# Patient Record
Sex: Female | Born: 1965 | Race: White | Hispanic: No | Smoking: Current every day smoker
Health system: Southern US, Community
[De-identification: ages and names within clinical notes are randomized; demographics above are authoritative.]

## PROBLEM LIST (undated history)

## (undated) DIAGNOSIS — D649 Anemia, unspecified: Secondary | ICD-10-CM

## (undated) HISTORY — PX: OTHER SURGICAL HISTORY: SHX169

## (undated) HISTORY — PX: ABDOMINAL HYSTERECTOMY: SHX81

## (undated) HISTORY — DX: Anemia, unspecified: D64.9

---

## 2008-01-09 ENCOUNTER — Emergency Department (HOSPITAL_COMMUNITY): Admission: EM | Admit: 2008-01-09 | Discharge: 2008-01-09 | Payer: Self-pay | Admitting: Emergency Medicine

## 2014-10-18 ENCOUNTER — Emergency Department (HOSPITAL_COMMUNITY): Payer: Self-pay

## 2014-10-18 ENCOUNTER — Emergency Department (HOSPITAL_COMMUNITY)
Admission: EM | Admit: 2014-10-18 | Discharge: 2014-10-18 | Disposition: A | Discharge status: Home / Self Care | Payer: Self-pay | Attending: Emergency Medicine | Admitting: Emergency Medicine

## 2014-10-18 ENCOUNTER — Encounter (HOSPITAL_COMMUNITY): Payer: Self-pay | Admitting: Emergency Medicine

## 2014-10-18 DIAGNOSIS — J018 Other acute sinusitis: Secondary | ICD-10-CM | POA: Insufficient documentation

## 2014-10-18 DIAGNOSIS — R059 Cough, unspecified: Secondary | ICD-10-CM

## 2014-10-18 DIAGNOSIS — Z720 Tobacco use: Secondary | ICD-10-CM | POA: Insufficient documentation

## 2014-10-18 DIAGNOSIS — J209 Acute bronchitis, unspecified: Secondary | ICD-10-CM | POA: Insufficient documentation

## 2014-10-18 DIAGNOSIS — R05 Cough: Secondary | ICD-10-CM

## 2014-10-18 DIAGNOSIS — J40 Bronchitis, not specified as acute or chronic: Secondary | ICD-10-CM

## 2014-10-18 MED ORDER — AMOXICILLIN 250 MG PO CAPS
ORAL_CAPSULE | ORAL | Status: AC
  Filled 2014-10-18: qty 1

## 2014-10-18 MED ORDER — PREDNISONE 10 MG PO TABS: ORAL_TABLET | ORAL | Status: DC

## 2014-10-18 MED ORDER — LORATADINE-PSEUDOEPHEDRINE ER 5-120 MG PO TB12: 1.0000 | ORAL_TABLET | Freq: Two times a day (BID) | ORAL | Status: DC

## 2014-10-18 MED ORDER — AMOXICILLIN 250 MG PO CAPS
500.0000 mg | ORAL_CAPSULE | Freq: Once | ORAL | Status: AC
  Administered 2014-10-18: 500 mg via ORAL
  Filled 2014-10-18: qty 2

## 2014-10-18 MED ORDER — ALBUTEROL SULFATE HFA 108 (90 BASE) MCG/ACT IN AERS
2.0000 | INHALATION_SPRAY | Freq: Once | RESPIRATORY_TRACT | Status: AC
Start: 2014-10-18 — End: 2014-10-18
  Administered 2014-10-18: 2 via RESPIRATORY_TRACT
  Filled 2014-10-18: qty 6.7

## 2014-10-18 MED ORDER — AMOXICILLIN 500 MG PO CAPS: 500.0000 mg | ORAL_CAPSULE | Freq: Three times a day (TID) | ORAL | Status: DC

## 2014-10-18 MED ORDER — PREDNISONE 50 MG PO TABS
60.0000 mg | ORAL_TABLET | Freq: Once | ORAL | Status: AC
  Administered 2014-10-18: 60 mg via ORAL
  Filled 2014-10-18 (×2): qty 1

## 2014-10-18 NOTE — Discharge Instructions (Signed)
Your x-ray suggests bronchitis and emphysema, please stop smoking.  Please use Amoxil 3 times daily, prednisone daily with food, Claritin-D 2 times daily for congestion. Please use albuterol 2 puffs every 4 hours. Please [facility], or the [facility] clinic to establish a primary care physician. Sinusitis Sinusitis is redness, soreness, and inflammation of the paranasal sinuses. Paranasal sinuses are air pockets within the bones of your face (beneath the eyes, the middle of the forehead, or above the eyes). In healthy paranasal sinuses, mucus is able to drain out, and air is able to circulate through them by way of your nose. However, when your paranasal sinuses are inflamed, mucus and air can become trapped. This can allow bacteria and other germs to grow and cause infection. Sinusitis can develop quickly and last only a short time (acute) or continue over a long period (chronic). Sinusitis that lasts for more than 12 weeks is considered chronic.  CAUSES  Causes of sinusitis include:  Allergies.  Structural abnormalities, such as displacement of the cartilage that separates your nostrils (deviated septum), which can decrease the air flow through your nose and sinuses and affect sinus drainage.  Functional abnormalities, such as when the small hairs (cilia) that line your sinuses and help remove mucus do not work properly or are not present. SIGNS AND SYMPTOMS  Symptoms of acute and chronic sinusitis are the same. The primary symptoms are pain and pressure around the affected sinuses. Other symptoms include:  Upper toothache.  Earache.  Headache.  Bad breath.  Decreased sense of smell and taste.  A cough, which worsens when you are lying flat.  Fatigue.  Fever.  Thick drainage from your nose, which often is green and may contain pus (purulent).  Swelling and warmth over the affected sinuses. DIAGNOSIS  Your health care provider will perform a  physical exam. During the exam, your health care provider may:  Look in your nose for signs of abnormal growths in your nostrils (nasal polyps).  Tap over the affected sinus to check for signs of infection.  View the inside of your sinuses (endoscopy) using an imaging device that has a light attached (endoscope). If your health care provider suspects that you have chronic sinusitis, one or more of the following tests may be recommended:  Allergy tests.  Nasal culture. A sample of mucus is taken from your nose, sent to a lab, and screened for bacteria.  Nasal cytology. A sample of mucus is taken from your nose and examined by your health care provider to determine if your sinusitis is related to an allergy. TREATMENT  Most cases of acute sinusitis are related to a viral infection and will resolve on their own within 10 days. Sometimes medicines are prescribed to help relieve symptoms (pain medicine, decongestants, nasal steroid sprays, or saline sprays).  However, for sinusitis related to a bacterial infection, your health care provider will prescribe antibiotic medicines. These are medicines that will help kill the bacteria causing the infection.  Rarely, sinusitis is caused by a fungal infection. In theses cases, your health care provider will prescribe antifungal medicine. For some cases of chronic sinusitis, surgery is needed. Generally, these are cases in which sinusitis recurs more than 3 times per year, despite other treatments. HOME CARE INSTRUCTIONS   Drink plenty of water. Water helps thin the mucus so your sinuses can drain more easily.  Use a humidifier.  Inhale steam 3 to 4 times a day (for example, sit in the  bathroom with the shower running).  Apply a warm, moist washcloth to your face 3 to 4 times a day, or as directed by your health care provider.  Use saline nasal sprays to help moisten and clean your sinuses.  Take medicines only as directed by your health care  provider.  If you were prescribed either an antibiotic or antifungal medicine, finish it all even if you start to feel better. SEEK IMMEDIATE MEDICAL CARE IF:  You have increasing pain or severe headaches.  You have nausea, vomiting, or drowsiness.  You have swelling around your face.  You have vision problems.  You have a stiff neck.  You have difficulty breathing. MAKE SURE YOU:   Understand these instructions.  Will watch your condition.  Will get help right away if you are not doing well or get worse. Document Released: 10/01/2005 Document Revised: 02/15/2014 Document Reviewed: 10/16/2011 [facility] Patient Information 2015 [place], [place]. This information is not intended to replace advice given to you by your health care provider. Make sure you discuss any questions you have with your health care provider.

## 2014-10-18 NOTE — ED Provider Notes (Signed)
CSN: [id number]     Arrival date & time 10/18/14  1315 History   First MD Initiated Contact with Patient 10/18/14 1549     Chief Complaint  Patient presents with  . Sinusitis     (Consider location/radiation/quality/duration/timing/severity/associated sxs/prior Treatment) HPI Comments: Patient is a 49 year old female who presents to the emergency department with complaint of cough and pain behind the ears.  The patient states that she has had problems with sinus congestion for 2-3 weeks. She has been trying over-the-counter medications and home remedies. In the last 2-3 days she's been having increasing pain behind both ears, and pressure about the sinus areas on her face. She denies any high fever, she has not been noticing blood in the mucus from her nasal passages. He has not been coughing up any blood. The patient denies any injury to her sinus areas or to her lungs. It is of note that she is a smoker, and has been smoking since age 58. The patient states that changes in environment, and particularly going from hot to cold or from cold to hot seems to aggravate her cough and her congestion even more.  The history is provided by the patient.    History reviewed. No pertinent past medical history. Past Surgical History  Procedure Laterality Date  . Abdominal hysterectomy    . Cervical cancer     No family history on file. History  Substance Use Topics  . Smoking status: Current Every Day Smoker -- 0.50 packs/day    Types: Cigarettes  . Smokeless tobacco: Not on file  . Alcohol Use: No   OB History    No data available     Review of Systems  Constitutional: Negative for activity change.       All ROS Neg except as noted in HPI  HENT: Positive for congestion, ear pain, postnasal drip and sinus pressure. Negative for nosebleeds.   Eyes: Negative for photophobia and discharge.  Respiratory: Positive for cough and wheezing. Negative for shortness of breath.   Cardiovascular:  Negative for chest pain and palpitations.  Gastrointestinal: Negative for abdominal pain and blood in stool.  Genitourinary: Negative for dysuria, frequency and hematuria.  Musculoskeletal: Negative for back pain, arthralgias and neck pain.  Skin: Negative.   Neurological: Negative for dizziness, seizures and speech difficulty.  Psychiatric/Behavioral: Negative for hallucinations and confusion.      Allergies  Review of patient's allergies indicates no known allergies.  Home Medications   Prior to Admission medications   Medication Sig Start Date End Date Taking? Authorizing Provider  ibuprofen (ADVIL,MOTRIN) 200 MG tablet Take 200 mg by mouth every 6 (six) hours as needed for mild pain or moderate pain.   Yes Historical Provider, MD   BP 114/86 mmHg  Pulse 75  Temp(Src) 98.1 F (36.7 C) (Oral)  Resp 18  Ht 5' 4.5" (1.638 m)  Wt 130 lb (58.968 kg)  BMI 21.98 kg/m2  SpO2 100% Physical Exam  Constitutional: She is oriented to person, place, and time. She appears well-developed and well-nourished.  Non-toxic appearance.  HENT:  Head: Normocephalic.  Right Ear: Tympanic membrane and external ear normal.  Left Ear: Tympanic membrane and external ear normal.  Nasal congestion present. Pain to percussion over the maxillary sinuses.  Nasal turbinates are swollen.  No mastoid area redness, swelling, or tenderness.  Eyes: EOM and lids are normal. Pupils are equal, round, and reactive to light.  Neck: Normal range of motion. Neck supple. Carotid bruit is not present.  Cardiovascular: Normal rate, regular rhythm, normal heart sounds, intact distal pulses and normal pulses.   Pulmonary/Chest: No respiratory distress. She has rhonchi.  Abdominal: Soft. Bowel sounds are normal. There is no tenderness. There is no guarding.  Musculoskeletal: Normal range of motion.  Lymphadenopathy:       Head (right side): No submandibular adenopathy present.       Head (left side): No submandibular  adenopathy present.    She has no cervical adenopathy.  Neurological: She is alert and oriented to person, place, and time. She has normal strength. No cranial nerve deficit or sensory deficit.  Skin: Skin is warm and dry.  Psychiatric: She has a normal mood and affect. Her speech is normal.  Nursing note and vitals reviewed.   ED Course  Procedures (including critical care time) Labs Review Labs Reviewed - No data to display  Imaging Review Dg Chest 2 View  10/18/2014   CLINICAL DATA:  Sinus congestion and bilateral ear pressure for 2 weeks. Nonproductive cough.  EXAM: CHEST  2 VIEW  COMPARISON:  PA and lateral chest 01/09/2008.  FINDINGS: The chest appears hyperexpanded with attenuation of the pulmonary vasculature. The lungs are clear. Heart size is normal. No pneumothorax or pleural effusion.  IMPRESSION: No acute disease.  Appearance of the chest suggestive of emphysema.   Electronically Signed   By: Drusilla Kanner M.D.   On: 10/18/2014 13:51     EKG Interpretation None      MDM  Vital signs within normal limits. Pulse oximetry is 100% on room air. Within normal limits by my interpretation. Chest x-ray shows no acute disease, but is suggestive of emphysema.  Findings on the examination and findings on the x-ray discussed with the patient in terms which he understands. Prescription for Amoxil, Claritin D, and prednisone given to the patient. An albuterol inhaler was also given to the patient. The patient is given resources to establish primary care physician with the [facility], or the community wellness [facility] in [place].    Final diagnoses:  Other acute sinusitis  Bronchitis    *I have reviewed nursing notes, vital signs, and all appropriate lab and imaging results for this patient.[address] PA-C 10/18/14 1725  Vida Roller, MD 10/18/14 [phone]

## 2014-10-18 NOTE — ED Notes (Signed)
PT c/o sinus congestion with pressure behind both ears x2 weeks. PT also reports nonproductive cough.

## 2015-07-18 IMAGING — CR DG CHEST 2V
2 series · 2 of 2 positions shown · non-contrast
Comparison: PA and lateral chest 01/09/2008.

CLINICAL DATA: Sinus congestion and bilateral ear pressure for 2
weeks. Nonproductive cough.

EXAM:
CHEST  2 VIEW

[view not recorded (1 of 2)]
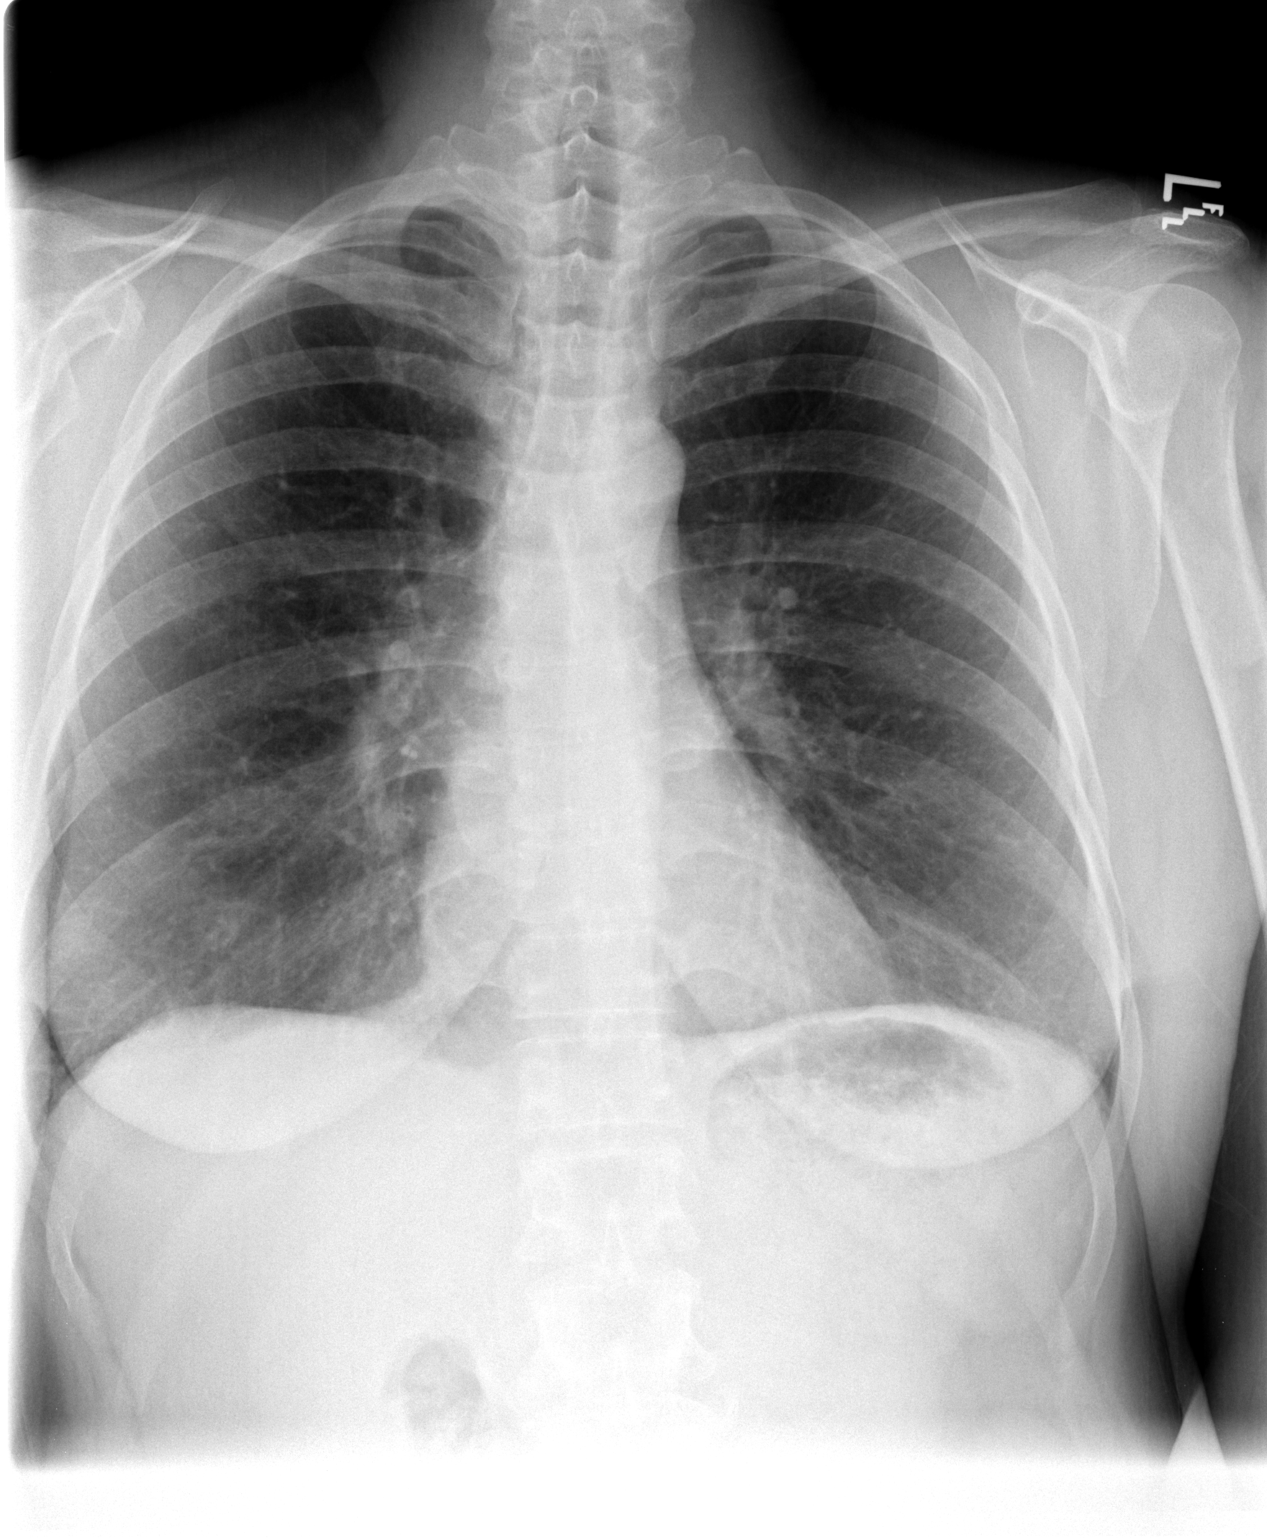

[view not recorded (2 of 2)]
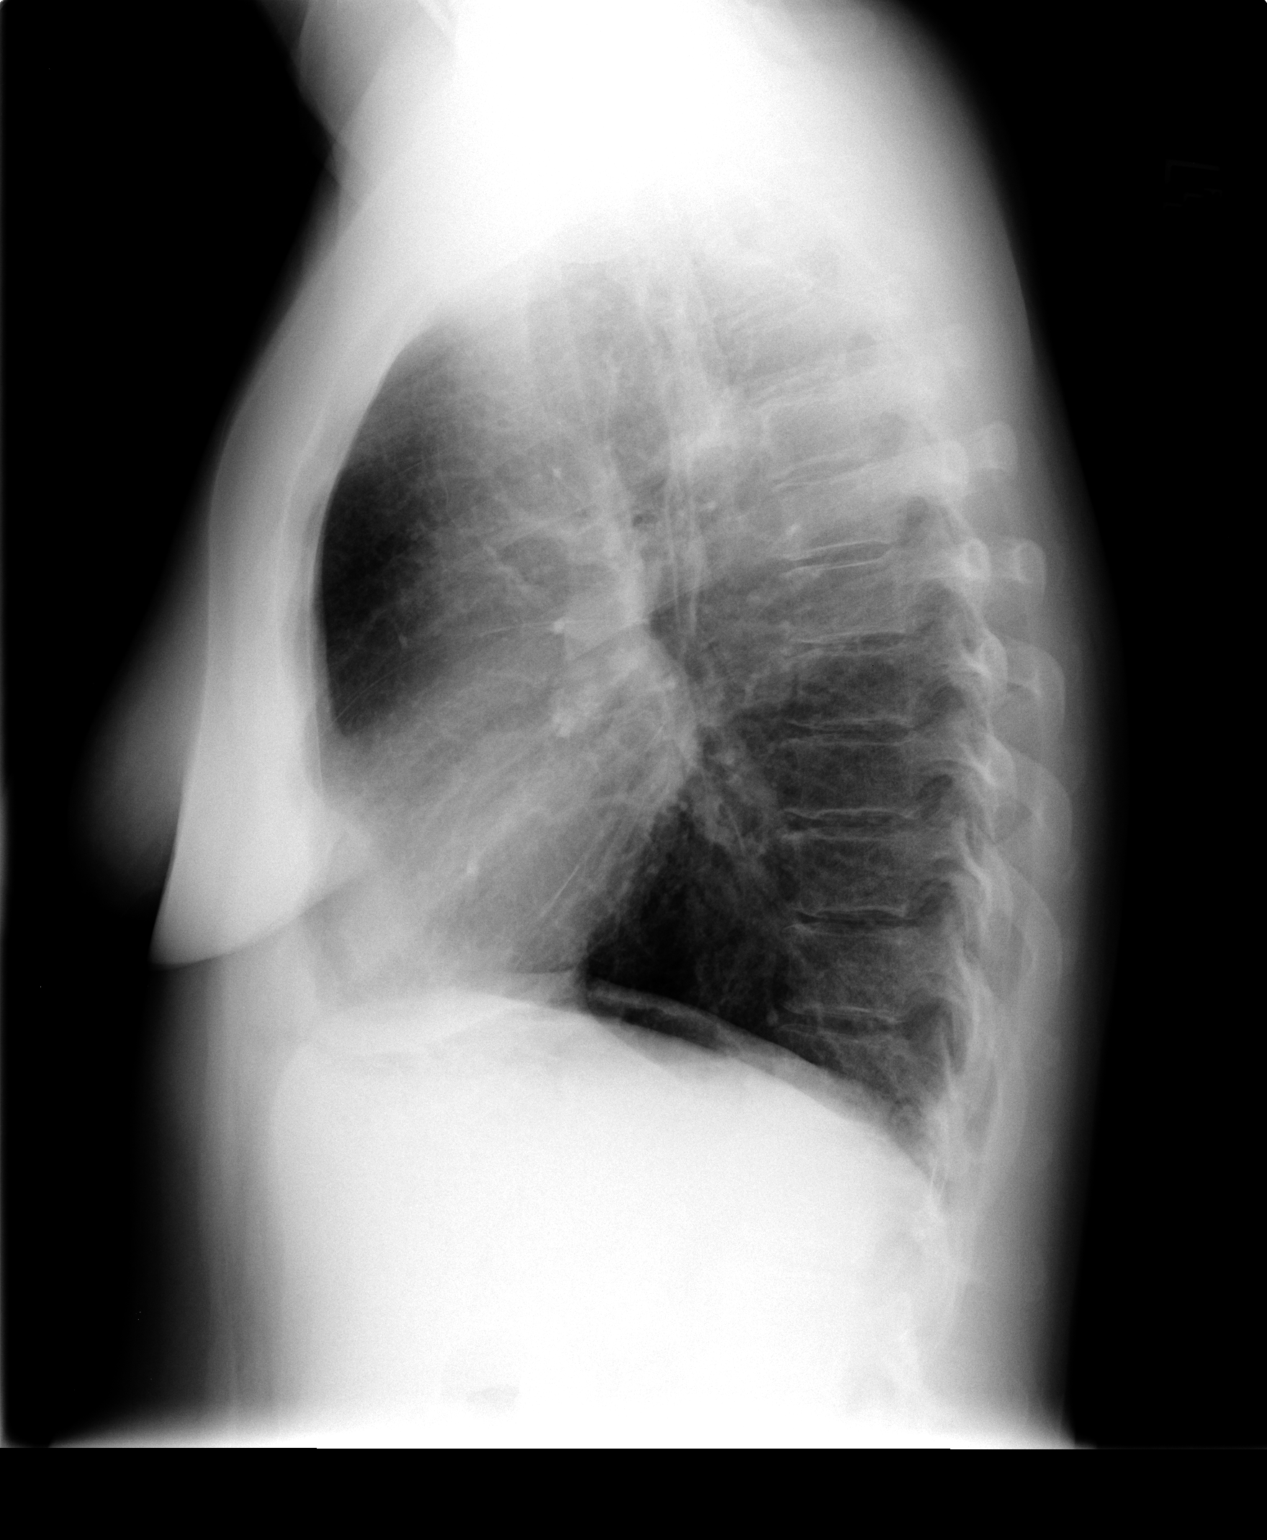

[2 of 2 positions shown; findings below may reference images not displayed]

FINDINGS: The chest appears hyperexpanded with attenuation of the pulmonary
vasculature. The lungs are clear. Heart size is normal. No
pneumothorax or pleural effusion.
IMPRESSION: No acute disease.  Appearance of the chest suggestive of emphysema.

## 2016-10-25 ENCOUNTER — Ambulatory Visit (INDEPENDENT_AMBULATORY_CARE_PROVIDER_SITE_OTHER): Payer: Self-pay | Admitting: Family Medicine

## 2016-10-25 ENCOUNTER — Encounter: Payer: Self-pay | Admitting: Family Medicine

## 2016-10-25 VITALS — BP 97/68 | HR 94 | Temp 98.5°F | Ht 65.0 in | Wt 125.8 lb

## 2016-10-25 DIAGNOSIS — Z720 Tobacco use: Secondary | ICD-10-CM

## 2016-10-25 DIAGNOSIS — R059 Cough, unspecified: Secondary | ICD-10-CM

## 2016-10-25 DIAGNOSIS — R05 Cough: Secondary | ICD-10-CM

## 2016-10-25 MED ORDER — PROMETHAZINE HCL 25 MG PO TABS: 25.0000 mg | ORAL_TABLET | Freq: Three times a day (TID) | ORAL | 0 refills | Status: AC | PRN

## 2016-10-25 MED ORDER — LEVOFLOXACIN 500 MG PO TABS: 500.0000 mg | ORAL_TABLET | Freq: Every day | ORAL | 0 refills | Status: AC

## 2016-10-25 MED ORDER — PREDNISONE 20 MG PO TABS: 40.0000 mg | ORAL_TABLET | Freq: Every day | ORAL | 0 refills | Status: AC

## 2016-10-25 NOTE — Progress Notes (Signed)
   HPI  Patient presents today here to establish care with acute illness.  Patient explains over the last 3 days she's had body aches, cough, nasal congestion, chills. She is measured fever as high as 102. She also has mild shortness of breath. She is a smoker but has been abstaining this week.  She has a history of pneumonia once previously and seemed to be more severe illness.  Patient has missed work 2 days and is still in her trial phase at work so she needs a note for work.  She does not have insurance yet.  She states that she's had several episodes of posttussive emesis and is having difficulty tolerating foods and fluids. She is able to tolerate small frequent amounts of fluid.  PMH: Smoker, cervical cancer Past surgical history- hysterectomy Social history- denies alcohol and drug use. Family history positive for diabetes and hypertension in father, heart disease in mother ROS: Per HPI  Objective: Ht 5\' 5"  (1.651 m)  Gen: NAD, alert, cooperative with exam HEENT: NCAT, no facial tenderness to palpation, oropharynx moist and clear, nares clear, TMs normal bilaterally CV: RRR, good S1/S2, no murmur Resp: Nonlabored, some scattered coarse breath sounds, some expiratory wheezes that are very faint, good air movement Ext: No edema, warm Neuro: Alert and oriented, No gross deficits  Assessment and plan:  # Cough Cough with fever, likely influenza. However patient has severe bronchitic changes as well with a history of smoking I have decided to treat her more aggressively with prednisone, also considering her limited access to medical care I have given her a course of Levaquin. Phenergan with posttussive emesis and difficulty tolerating foods and fluids currently. One week out of work from the onset of symptoms, return next Tuesday  Tobacco abuse Patient has been abstaining this week, she is considering quitting  Committed return when patient has insurance coverage for full  physical exam. No Pap smear necessary with history of hysterectomy. Mammogram and colonoscopy are both new. Screening labs would be helpful.   Meds ordered this encounter  Medications  . promethazine (PHENERGAN) 25 MG tablet    Sig: Take 1 tablet (25 mg total) by mouth every 8 (eight) hours as needed for nausea or vomiting.    Dispense:  20 tablet    Refill:  0  . predniSONE (DELTASONE) 20 MG tablet    Sig: Take 2 tablets (40 mg total) by mouth daily with breakfast.    Dispense:  10 tablet    Refill:  0  . levofloxacin (LEVAQUIN) 500 MG tablet    Sig: Take 1 tablet (500 mg total) by mouth daily.    Dispense:  7 tablet    Refill:  0    Murtis SinkSam Sheralee Qazi, MD [facility] 10/25/2016, 11:45 AM

## 2016-10-25 NOTE — Patient Instructions (Signed)
Great to see you!  Start prednisone today as well as levaquin today.   Use phenergan for nausea, only as needed. It will likely make you sleepy.

## 2022-07-25 ENCOUNTER — Emergency Department (HOSPITAL_COMMUNITY): Payer: 59

## 2022-07-25 ENCOUNTER — Emergency Department (HOSPITAL_COMMUNITY)
Admission: EM | Admit: 2022-07-25 | Discharge: 2022-07-25 | Disposition: A | Discharge status: Home / Self Care | Payer: 59 | Attending: Student | Admitting: Student

## 2022-07-25 ENCOUNTER — Other Ambulatory Visit: Payer: Self-pay

## 2022-07-25 ENCOUNTER — Encounter (HOSPITAL_COMMUNITY): Payer: Self-pay | Admitting: Emergency Medicine

## 2022-07-25 DIAGNOSIS — J019 Acute sinusitis, unspecified: Secondary | ICD-10-CM | POA: Insufficient documentation

## 2022-07-25 DIAGNOSIS — R0981 Nasal congestion: Secondary | ICD-10-CM | POA: Diagnosis not present

## 2022-07-25 LAB — GROUP A STREP BY PCR: Group A Strep by PCR: NOT DETECTED

## 2022-07-25 MED ORDER — AMOXICILLIN-POT CLAVULANATE 875-125 MG PO TABS: 1.0000 | ORAL_TABLET | Freq: Two times a day (BID) | ORAL | 0 refills | Status: AC

## 2022-07-25 NOTE — ED Triage Notes (Signed)
Pt c/o raspy, painful throat x 1 month. States this am could not talk at all for a while. Arrived. Nad. Color wnl. No obvious  oral swelling/redness noted.

## 2022-07-25 NOTE — Discharge Instructions (Signed)
Your strep test was negative today.  X-ray negative for pneumonia.  As we discussed most likely have sinusitis.  Have sent antibiotics into the pharmacy for you.  Follow-up with your primary care provider.  If you do not have 1 you can follow-up with [facility] to establish care.  For any concerning symptoms please return to the emergency room.

## 2022-07-25 NOTE — ED Provider Notes (Signed)
[facility] EMERGENCY DEPARTMENT Provider Note   CSN: [id number] Arrival date & time: 07/25/22  1104     History  Chief Complaint  Patient presents with   Sore Throat    Yvonne Austin is a 57 y.o. female.  56 year old female presents today for evaluation of pharyngitis, hoarse voice of about 1 month duration.  She also has cough, sinus congestion, and postnasal drainage.  She denies shortness of breath, fever, chest pain, difficulty swallowing, drooling.  She states her hoarseness is worse after lying down or sleeping.  The history is provided by the patient. No language interpreter was used.       Home Medications Prior to Admission medications   Medication Sig Start Date End Date Taking? Authorizing Provider  levofloxacin (LEVAQUIN) 500 MG tablet Take 1 tablet (500 mg total) by mouth daily. 10/25/16   Timmothy Euler, MD  predniSONE (DELTASONE) 20 MG tablet Take 2 tablets (40 mg total) by mouth daily with breakfast. 10/25/16   Timmothy Euler, MD  promethazine (PHENERGAN) 25 MG tablet Take 1 tablet (25 mg total) by mouth every 8 (eight) hours as needed for nausea or vomiting. 10/25/16   Timmothy Euler, MD      Allergies    Patient has no known allergies.    Review of Systems   Review of Systems  Constitutional:  Negative for chills and fever.  HENT:  Positive for congestion and voice change. Negative for drooling and trouble swallowing.   Respiratory:  Positive for cough. Negative for shortness of breath.   Cardiovascular:  Negative for chest pain.  All other systems reviewed and are negative.   Physical Exam Updated Vital Signs BP (!) 146/86 (BP Location: Right Arm)   Pulse 72   Temp 97.8 F (36.6 C) (Oral)   Resp 20   SpO2 96%  Physical Exam Vitals and nursing note reviewed.  Constitutional:      General: She is not in acute distress.    Appearance: Normal appearance. She is not ill-appearing.  HENT:     Head: Normocephalic and atraumatic.      Nose: Nose normal.  Eyes:     General: No scleral icterus.    Extraocular Movements: Extraocular movements intact.     Conjunctiva/sclera: Conjunctivae normal.  Cardiovascular:     Rate and Rhythm: Normal rate and regular rhythm.     Pulses: Normal pulses.     Heart sounds: Normal heart sounds.  Pulmonary:     Effort: Pulmonary effort is normal. No respiratory distress.     Breath sounds: Normal breath sounds. No wheezing or rales.  Abdominal:     General: There is no distension.     Tenderness: There is no abdominal tenderness.  Musculoskeletal:        General: Normal range of motion.     Cervical back: Normal range of motion.  Skin:    General: Skin is warm and dry.  Neurological:     General: No focal deficit present.     Mental Status: She is alert. Mental status is at baseline.     ED Results / Procedures / Treatments   Labs (all labs ordered are listed, but only abnormal results are displayed) Labs Reviewed  GROUP A STREP BY PCR    EKG None  Radiology DG Chest 2 View  Result Date: 07/25/2022 CLINICAL DATA:  Productive cough with sore throat EXAM: CHEST - 2 VIEW COMPARISON:  Chest 10/18/2014 FINDINGS: Pulmonary hyperinflation. Lungs are clear  without infiltrate or effusion. Heart size and pulmonary vascularity normal. No acute skeletal abnormality. IMPRESSION: No active cardiopulmonary disease. Electronically Signed   By: Marlan Palau M.D.   On: 07/25/2022 13:41    Procedures Procedures    Medications Ordered in ED Medications - No data to display  ED Course/ Medical Decision Making/ A&P                           Medical Decision Making Amount and/or Complexity of Data Reviewed Radiology: ordered.   Medical Decision Making / ED Course   This patient presents to the ED for concern of hoarseness, congestion, this involves an extensive number of treatment options, and is a complaint that carries with it a high risk of complications and morbidity.  The  differential diagnosis includes pneumonia, URI, sinusitis, abscess  MDM: 56 year old female presents today for evaluation of above-mentioned symptoms.  Strep negative.  Chest x-ray without evidence of pneumonia.  Likely sinusitis.  Hoarse voice probably from postnasal drainage.  Will start patient on Augmentin.  Return precautions discussed.  No evidence of peritonsillar abscess, retropharyngeal abscess, Ludwick's angina.  Vital signs are stable.  Patient is appropriate for discharge.  Discharged in stable condition.  No indication for additional work-up or management.  Lab Tests: -I ordered, reviewed, and interpreted labs.   The pertinent results include:   Labs Reviewed  GROUP A STREP BY PCR      EKG  EKG Interpretation  Date/Time:    Ventricular Rate:    PR Interval:    QRS Duration:   QT Interval:    QTC Calculation:   R Axis:     Text Interpretation:           Imaging Studies ordered: I ordered imaging studies including CXR I independently visualized and interpreted imaging. I agree with the radiologist interpretation   Medicines ordered and prescription drug management: No orders of the defined types were placed in this encounter.   -I have reviewed the patients home medicines and have made adjustments as needed  Reevaluation: After the interventions noted above, I reevaluated the patient and found that they have :stayed the same  Co morbidities that complicate the patient evaluation  Past Medical History:  Diagnosis Date   Anemia       Dispostion: Patient appropriate for discharge.  Discharged in stable condition.  Return precaution discussed.      Final Clinical Impression(s) / ED Diagnoses Final diagnoses:  Acute sinusitis, recurrence not specified, unspecified location    Rx / DC Orders ED Discharge Orders          Ordered    amoxicillin-clavulanate (AUGMENTIN) 875-125 MG tablet  Every 12 hours        07/25/22 1407               Marita [place], PA-C 07/25/22 1408    Kommor, Bayard, MD 07/25/22 2139

## 2022-08-03 DIAGNOSIS — J410 Simple chronic bronchitis: Secondary | ICD-10-CM | POA: Diagnosis not present

## 2022-08-03 DIAGNOSIS — Z720 Tobacco use: Secondary | ICD-10-CM | POA: Diagnosis not present

## 2022-08-03 DIAGNOSIS — R490 Dysphonia: Secondary | ICD-10-CM | POA: Diagnosis not present

## 2022-08-03 DIAGNOSIS — R059 Cough, unspecified: Secondary | ICD-10-CM | POA: Diagnosis not present

## 2022-08-03 DIAGNOSIS — J040 Acute laryngitis: Secondary | ICD-10-CM | POA: Diagnosis not present

## 2022-08-03 DIAGNOSIS — Z87891 Personal history of nicotine dependence: Secondary | ICD-10-CM | POA: Diagnosis not present

## 2022-08-12 DIAGNOSIS — R490 Dysphonia: Secondary | ICD-10-CM | POA: Diagnosis not present

## 2022-08-12 DIAGNOSIS — Z20822 Contact with and (suspected) exposure to covid-19: Secondary | ICD-10-CM | POA: Diagnosis not present

## 2022-09-04 DIAGNOSIS — F17210 Nicotine dependence, cigarettes, uncomplicated: Secondary | ICD-10-CM | POA: Diagnosis not present

## 2022-09-04 DIAGNOSIS — J387 Other diseases of larynx: Secondary | ICD-10-CM | POA: Diagnosis not present

## 2022-09-04 DIAGNOSIS — R69 Illness, unspecified: Secondary | ICD-10-CM | POA: Diagnosis not present

## 2022-09-04 DIAGNOSIS — R490 Dysphonia: Secondary | ICD-10-CM | POA: Diagnosis not present

## 2022-09-04 DIAGNOSIS — J449 Chronic obstructive pulmonary disease, unspecified: Secondary | ICD-10-CM | POA: Diagnosis not present

## 2022-09-04 DIAGNOSIS — J381 Polyp of vocal cord and larynx: Secondary | ICD-10-CM | POA: Diagnosis not present

## 2023-10-07 ENCOUNTER — Telehealth: Payer: Self-pay

## 2023-10-07 NOTE — Progress Notes (Signed)
 [facility] Assistant attempted to call patient on today regarding preventative mammogram screening. No answer from patient after multiple rings. Assistant left confidential voicemail for patient to return call.  Will call back patient back for final attempt.   Baruch Gouty [place]/VBCI  [facility] Assistant-Population Health [phone]
